# Patient Record
Sex: Male | Born: 1975
Health system: Southern US, Community
[De-identification: ages and names within clinical notes are randomized; demographics above are authoritative.]

## PROBLEM LIST (undated history)

## (undated) DIAGNOSIS — I10 Essential (primary) hypertension: Secondary | ICD-10-CM

## (undated) HISTORY — DX: Essential (primary) hypertension: I10

---

## 2006-10-05 ENCOUNTER — Ambulatory Visit: Payer: Self-pay | Admitting: Cardiovascular Disease

## 2006-10-18 ENCOUNTER — Ambulatory Visit: Payer: Self-pay

## 2006-11-07 ENCOUNTER — Ambulatory Visit: Payer: Self-pay | Admitting: Cardiovascular Disease

## 2006-11-07 LAB — CONVERTED CEMR LAB
BUN: 15 mg/dL (ref 6–23)
Basophils Absolute: 0 10*3/uL (ref 0.0–0.1)
Basophils Relative: 0.4 % (ref 0.0–1.0)
CO2: 29 meq/L (ref 19–32)
Calcium: 9 mg/dL (ref 8.4–10.5)
Creatinine, Ser: 1.2 mg/dL (ref 0.4–1.5)
INR: 0.9 (ref 0.9–2.0)
MCHC: 35.3 g/dL (ref 30.0–36.0)
Monocytes Absolute: 0.7 10*3/uL (ref 0.2–0.7)
Monocytes Relative: 8.1 % (ref 3.0–11.0)
Platelets: 247 10*3/uL (ref 150–400)
Potassium: 4 meq/L (ref 3.5–5.1)
Prothrombin Time: 11.7 s (ref 10.0–14.0)
RBC: 5.07 M/uL (ref 4.22–5.81)
RDW: 12.3 % (ref 11.5–14.6)
aPTT: 30.8 s (ref 26.5–36.5)

## 2006-11-12 ENCOUNTER — Ambulatory Visit: Payer: Self-pay | Admitting: Cardiovascular Disease

## 2006-11-12 ENCOUNTER — Inpatient Hospital Stay (HOSPITAL_BASED_OUTPATIENT_CLINIC_OR_DEPARTMENT_OTHER): Admission: RE | Admit: 2006-11-12 | Discharge: 2006-11-12 | Payer: Self-pay | Admitting: Cardiovascular Disease

## 2015-08-22 HISTORY — PX: VASECTOMY: SHX75

## 2019-12-29 ENCOUNTER — Other Ambulatory Visit: Payer: Self-pay | Admitting: *Deleted

## 2019-12-29 ENCOUNTER — Other Ambulatory Visit: Payer: Self-pay

## 2019-12-29 ENCOUNTER — Ambulatory Visit
Admission: RE | Admit: 2019-12-29 | Discharge: 2019-12-29 | Disposition: A | Discharge status: Home / Self Care | Payer: No Typology Code available for payment source | Source: Ambulatory Visit | Attending: Nurse Practitioner | Admitting: Nurse Practitioner

## 2019-12-29 ENCOUNTER — Other Ambulatory Visit: Payer: Self-pay | Admitting: Nurse Practitioner

## 2019-12-29 DIAGNOSIS — T1490XA Injury, unspecified, initial encounter: Secondary | ICD-10-CM

## 2021-03-05 ENCOUNTER — Ambulatory Visit: Payer: Self-pay

## 2021-03-11 ENCOUNTER — Ambulatory Visit (INDEPENDENT_AMBULATORY_CARE_PROVIDER_SITE_OTHER): Payer: 59

## 2021-03-11 ENCOUNTER — Ambulatory Visit
Admission: RE | Admit: 2021-03-11 | Discharge: 2021-03-11 | Disposition: A | Discharge status: Home / Self Care | Payer: 59 | Source: Ambulatory Visit | Attending: Student | Admitting: Student

## 2021-03-11 ENCOUNTER — Other Ambulatory Visit: Payer: Self-pay

## 2021-03-11 VITALS — BP 176/109 | HR 96 | Temp 97.5°F | Resp 22

## 2021-03-11 DIAGNOSIS — S46812A Strain of other muscles, fascia and tendons at shoulder and upper arm level, left arm, initial encounter: Secondary | ICD-10-CM

## 2021-03-11 DIAGNOSIS — M542 Cervicalgia: Secondary | ICD-10-CM

## 2021-03-11 DIAGNOSIS — S161XXA Strain of muscle, fascia and tendon at neck level, initial encounter: Secondary | ICD-10-CM | POA: Diagnosis not present

## 2021-03-11 DIAGNOSIS — M62838 Other muscle spasm: Secondary | ICD-10-CM | POA: Diagnosis not present

## 2021-03-11 DIAGNOSIS — F5102 Adjustment insomnia: Secondary | ICD-10-CM

## 2021-03-11 MED ORDER — KETOROLAC TROMETHAMINE 10 MG PO TABS: 10.0000 mg | ORAL_TABLET | Freq: Four times a day (QID) | ORAL | 0 refills | Status: DC | PRN

## 2021-03-11 MED ORDER — TIZANIDINE HCL 2 MG PO TABS: 2.0000 mg | ORAL_TABLET | Freq: Three times a day (TID) | ORAL | 0 refills | Status: DC | PRN

## 2021-03-11 MED ORDER — HYDROXYZINE HCL 25 MG PO TABS: 25.0000 mg | ORAL_TABLET | Freq: Four times a day (QID) | ORAL | 0 refills | Status: DC
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
## 2021-03-11 NOTE — Discharge Instructions (Addendum)
-  Start the muscle relaxer-Zanaflex (tizanidine), up to 3 times daily for muscle spasms and pain.  This can make you drowsy, so take at bedtime or when you do not need to drive or operate machinery. -For insomnia, hydroxyzine (Atarax).  You can also take this if you feel anxious throughout the day, but be careful because it can cause drowsiness.  Do not take the hydroxyzine and tizanidine together, as they both can cause drowsiness.  For pain, stop ibuprofen and start Toradol.  Do not take other NSAIDs while you take this medication like ibuprofen and Aleve, but you can still take Tylenol 1000 mg up to 3 times daily.  Make sure to take this medication with food, and avoid taking alcohol with this medication. -Follow-up with [facility] if symptoms worsen/persist in about 5 days, information below.
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
## 2021-08-21 HISTORY — PX: VASECTOMY REVERSAL: SHX243

## 2022-01-11 ENCOUNTER — Other Ambulatory Visit: Payer: Self-pay | Admitting: Neurological Surgery

## 2022-01-11 DIAGNOSIS — M542 Cervicalgia: Secondary | ICD-10-CM

## 2022-01-23 ENCOUNTER — Ambulatory Visit
Admission: RE | Admit: 2022-01-23 | Discharge: 2022-01-23 | Disposition: A | Discharge status: Home / Self Care | Payer: 59 | Source: Ambulatory Visit | Attending: Neurological Surgery | Admitting: Neurological Surgery

## 2022-01-23 DIAGNOSIS — M542 Cervicalgia: Secondary | ICD-10-CM

## 2022-02-14 ENCOUNTER — Ambulatory Visit: Payer: 59 | Admitting: Physical Therapy

## 2022-02-17 ENCOUNTER — Ambulatory Visit
Admission: RE | Admit: 2022-02-17 | Discharge: 2022-02-17 | Disposition: A | Discharge status: Home / Self Care | Payer: 59 | Source: Ambulatory Visit | Attending: Family Medicine | Admitting: Family Medicine

## 2022-02-17 ENCOUNTER — Ambulatory Visit (HOSPITAL_COMMUNITY): Payer: Self-pay

## 2022-02-17 ENCOUNTER — Ambulatory Visit (INDEPENDENT_AMBULATORY_CARE_PROVIDER_SITE_OTHER): Payer: 59

## 2022-02-17 VITALS — BP 151/106 | HR 93 | Temp 97.9°F | Resp 20

## 2022-02-17 DIAGNOSIS — S6701XA Crushing injury of right thumb, initial encounter: Secondary | ICD-10-CM | POA: Diagnosis not present

## 2022-02-17 DIAGNOSIS — L03012 Cellulitis of left finger: Secondary | ICD-10-CM

## 2022-02-17 DIAGNOSIS — L03011 Cellulitis of right finger: Secondary | ICD-10-CM | POA: Diagnosis not present

## 2022-02-17 DIAGNOSIS — M79644 Pain in right finger(s): Secondary | ICD-10-CM | POA: Diagnosis not present

## 2022-02-17 MED ORDER — PREDNISONE 20 MG PO TABS: 20.0000 mg | ORAL_TABLET | Freq: Every day | ORAL | 0 refills | Status: AC

## 2022-02-17 MED ORDER — KETOROLAC TROMETHAMINE 10 MG PO TABS: 10.0000 mg | ORAL_TABLET | Freq: Three times a day (TID) | ORAL | 0 refills | Status: DC | PRN

## 2022-02-17 MED ORDER — SULFAMETHOXAZOLE-TRIMETHOPRIM 800-160 MG PO TABS: 1.0000 | ORAL_TABLET | Freq: Two times a day (BID) | ORAL | 0 refills | Status: DC

## 2022-02-17 NOTE — Discharge Instructions (Signed)
Start prednisone and Bactrim today for swelling, inflammation and infection involving your right thumb.  Your x-ray was negative for fracture.  Toradol you may take after finishing the prednisone for acute pain. Your symptoms should gradually improve as your infection and swelling resolves.

## 2022-02-17 NOTE — ED Triage Notes (Addendum)
Pt presents with complaints of smashing his right thumb Tuesday night while grilling. Pain has not subsided with ibuprofen.

## 2022-02-17 NOTE — ED Provider Notes (Signed)
[facility]    CSN: [id number] Arrival date & time: 02/17/22  1435      History   Chief Complaint Chief Complaint  Patient presents with  . Finger Injury    Entered by patient    HPI Drew Navarro is a 46 y.o. male.   HPI Patient here for evaluation of a right thumb injury.  Patient reports that he was grilling and inadvertently slammed the tip of his right thumb down on his grill.  He reported initially having some mild bruising but no significant pain.  Over the last day he has noticed increased swelling, redness and discoloration at the cuticle level of his thumb.  He is also had some swelling at the lateral aspect of his thumb proximal to the nailbed.  History reviewed. No pertinent past medical history.  There are no problems to display for this patient.   History reviewed. No pertinent surgical history.     Home Medications    Prior to Admission medications   Medication Sig Start Date End Date Taking? Authorizing Provider  hydrOXYzine (ATARAX/VISTARIL) 25 MG tablet Take 1 tablet (25 mg total) by mouth every 6 (six) hours. Taken up to every 6 hours for anxiety and insomnia. This medication will cause drowsiness. 03/11/21   Rhys Martini, PA-C  ketorolac (TORADOL) 10 MG tablet Take 1 tablet (10 mg total) by mouth every 6 (six) hours as needed. 03/11/21   Rhys Martini, PA-C  tiZANidine (ZANAFLEX) 2 MG tablet Take 1 tablet (2 mg total) by mouth every 8 (eight) hours as needed for muscle spasms. 03/11/21   Rhys Martini, PA-C    Family History Family History  Family history unknown: Yes    Social History Social History   Tobacco Use  . Smoking status: Never  . Smokeless tobacco: Never  Substance Use Topics  . Alcohol use: Not Currently  . Drug use: Never     Allergies   Patient has no known allergies.   Review of Systems Review of Systems   Physical Exam Triage Vital Signs ED Triage Vitals  Enc Vitals Group     BP 02/17/22 1508  (!) 151/106     Pulse Rate 02/17/22 1505 93     Resp 02/17/22 1505 20     Temp 02/17/22 1505 97.9 F (36.6 C)     Temp Source 02/17/22 1505 Oral     SpO2 02/17/22 1505 96 %     Weight --      Height --      Head Circumference --      Peak Flow --      Pain Score 02/17/22 1506 10     Pain Loc --      Pain Edu? --      Excl. in GC? --    No data found.  Updated Vital Signs BP (!) 151/106 Comment: taken x 3  Pulse 93   Temp 97.9 F (36.6 C) (Oral)   Resp 20   SpO2 96%   Visual Acuity Right Eye Distance:   Left Eye Distance:   Bilateral Distance:    Right Eye Near:   Left Eye Near:    Bilateral Near:     Physical Exam   UC Treatments / Results  Labs (all labs ordered are listed, but only abnormal results are displayed) Labs Reviewed - No data to display  EKG   Radiology DG Finger Thumb Right  Result Date: 02/17/2022 CLINICAL DATA:  Thumb injury  EXAM: RIGHT THUMB 2+V COMPARISON:  None Available. FINDINGS: There is no evidence of fracture or dislocation. There is no evidence of arthropathy or other focal bone abnormality. Soft tissues are unremarkable. IMPRESSION: Negative. Electronically Signed   By: Jasmine Pang M.D.   On: 02/17/2022 15:28    Procedures Procedures (including critical care time)  Medications Ordered in UC Medications - No data to display  Initial Impression / Assessment and Plan / UC Course  I have reviewed the triage vital signs and the nursing notes.  Pertinent labs & imaging results that were available during my care of the patient were reviewed by me and considered in my medical decision making (see chart for details).     *** Final Clinical Impressions(s) / UC Diagnoses   Final diagnoses:  None   Discharge Instructions   None    ED Prescriptions   None    PDMP not reviewed this encounter.

## 2022-02-22 ENCOUNTER — Ambulatory Visit: Payer: 59 | Admitting: Nurse Practitioner
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
## 2022-02-23 ENCOUNTER — Ambulatory Visit: Payer: 59 | Attending: Neurological Surgery

## 2022-02-23 DIAGNOSIS — M542 Cervicalgia: Secondary | ICD-10-CM | POA: Diagnosis present

## 2022-03-08 ENCOUNTER — Ambulatory Visit (INDEPENDENT_AMBULATORY_CARE_PROVIDER_SITE_OTHER): Payer: 59 | Admitting: Nurse Practitioner

## 2022-03-08 ENCOUNTER — Encounter: Payer: Self-pay | Admitting: Nurse Practitioner

## 2022-03-08 VITALS — BP 145/88 | HR 84 | Temp 97.8°F | Ht 74.0 in | Wt 298.0 lb

## 2022-03-08 DIAGNOSIS — Z6836 Body mass index (BMI) 36.0-36.9, adult: Secondary | ICD-10-CM | POA: Insufficient documentation

## 2022-03-08 DIAGNOSIS — Z6838 Body mass index (BMI) 38.0-38.9, adult: Secondary | ICD-10-CM

## 2022-03-08 DIAGNOSIS — I10 Essential (primary) hypertension: Secondary | ICD-10-CM | POA: Insufficient documentation

## 2022-03-08 DIAGNOSIS — Z7689 Persons encountering health services in other specified circumstances: Secondary | ICD-10-CM | POA: Diagnosis not present

## 2022-03-08 DIAGNOSIS — F4310 Post-traumatic stress disorder, unspecified: Secondary | ICD-10-CM

## 2022-03-08 DIAGNOSIS — R030 Elevated blood-pressure reading, without diagnosis of hypertension: Secondary | ICD-10-CM

## 2022-03-08 HISTORY — DX: Post-traumatic stress disorder, unspecified: F43.10
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
## 2022-03-09 ENCOUNTER — Ambulatory Visit: Payer: 59

## 2022-04-19 ENCOUNTER — Encounter: Payer: 59 | Admitting: Nurse Practitioner

## 2022-04-19 NOTE — Progress Notes (Deleted)
Complete physical exam   Patient: Drew Navarro   DOB: [birth year]   46 y.o. Male  MRN: [id number] Visit Date: 04/19/2022    No chief complaint on file.  Subjective    Drew Navarro is a 46 y.o. male who presents today for a complete physical exam.  He reports consuming a {diet types:17450} diet. {Exercise:19826} He generally feels {well/fairly well/poorly:18703}. He {does/does not:200015} have additional problems to discuss today. Marland Kitchen  HPI  Annual physical  -elevated blood pressure  -recently had vasectomy  -history of PTSD related to death of his daughter.    Past Medical History:  Diagnosis Date   PTSD (post-traumatic stress disorder) 03/08/2022   No past surgical history on file. Social History   Socioeconomic History   Marital status: Married    Spouse name: Not on file   Number of children: Not on file   Years of education: Not on file   Highest education level: Not on file  Occupational History   Not on file  Tobacco Use   Smoking status: Never   Smokeless tobacco: Never  Substance and Sexual Activity   Alcohol use: Yes    Alcohol/week: 10.0 standard drinks of alcohol    Types: 10 Cans of beer per week   Drug use: Never   Sexual activity: Yes  Other Topics Concern   Not on file  Social History Narrative   Not on file   Social Determinants of Health   Financial Resource Strain: Not on file  Food Insecurity: Not on file  Transportation Needs: Not on file  Physical Activity: Not on file  Stress: Not on file  Social Connections: Not on file  Intimate Partner Violence: Not on file   Family Status  Relation Name Status   Father  (Not Specified)   Family History  Problem Relation Age of Onset   Heart attack Father    No Known Allergies  Patient Care Team: Carlean Jews, NP as PCP - General (Family Medicine)   Medications: Outpatient Medications Prior to Visit  Medication Sig   hydrOXYzine (ATARAX/VISTARIL) 25 MG tablet Take 1 tablet (25  mg total) by mouth every 6 (six) hours. Taken up to every 6 hours for anxiety and insomnia. This medication will cause drowsiness.   ketorolac (TORADOL) 10 MG tablet Take 1 tablet (10 mg total) by mouth every 8 (eight) hours as needed for moderate pain or severe pain.   sulfamethoxazole-trimethoprim (BACTRIM DS) 800-160 MG tablet Take 1 tablet by mouth 2 (two) times daily.   tiZANidine (ZANAFLEX) 2 MG tablet Take 1 tablet (2 mg total) by mouth every 8 (eight) hours as needed for muscle spasms.   No facility-administered medications prior to visit.    Review of Systems  {Labs (Optional):23779}   Objective    There were no vitals taken for this visit. BP Readings from Last 3 Encounters:  03/08/22 (!) 145/88  02/17/22 (!) 151/106  03/11/21 (!) 176/109    Wt Readings from Last 3 Encounters:  03/08/22 298 lb (135.2 kg)     Physical Exam  ***  Last depression screening scores    03/08/2022    3:04 PM  PHQ 2/9 Scores  PHQ - 2 Score 2  PHQ- 9 Score 9   Last fall risk screening     No data to display         Last Audit-C alcohol use screening     No data to display  A score of 3 or more in women, and 4 or more in men indicates increased risk for alcohol abuse, EXCEPT if all of the points are from question 1   No results found for any visits on 04/19/22.  Assessment & Plan    Routine Health Maintenance and Physical Exam  Exercise Activities and Dietary recommendations  Goals   None      There is no immunization history on file for this patient.  Health Maintenance  Topic Date Due   COVID-19 Vaccine (1) Never done   HIV Screening  Never done   Hepatitis C Screening  Never done   TETANUS/TDAP  Never done   INFLUENZA VACCINE  03/21/2022   HPV VACCINES  Aged Out    Discussed health benefits of physical activity, and encouraged him to engage in regular exercise appropriate for his age and condition.  Problem List Items Addressed This Visit   None     No follow-ups on file.        Carlean Jews, NP  [facility] [phone] (phone) [phone] (fax)  [facility]

## 2022-07-11 ENCOUNTER — Ambulatory Visit
Admission: RE | Admit: 2022-07-11 | Discharge: 2022-07-11 | Disposition: A | Discharge status: Home / Self Care | Payer: 59 | Source: Ambulatory Visit | Attending: Physician Assistant | Admitting: Physician Assistant

## 2022-07-11 VITALS — BP 146/94 | HR 88 | Temp 98.0°F | Resp 19

## 2022-07-11 DIAGNOSIS — Z20822 Contact with and (suspected) exposure to covid-19: Secondary | ICD-10-CM | POA: Diagnosis present

## 2022-07-11 DIAGNOSIS — J069 Acute upper respiratory infection, unspecified: Secondary | ICD-10-CM | POA: Diagnosis present

## 2022-07-11 LAB — RESP PANEL BY RT-PCR (FLU A&B, COVID) ARPGX2
Influenza A by PCR: POSITIVE — AB
Influenza B by PCR: NEGATIVE
SARS Coronavirus 2 by RT PCR: NEGATIVE

## 2022-07-11 NOTE — ED Triage Notes (Signed)
Pt presents to [facility] with co of cough and congestion since Sunday. Pts wife tested pos for covid yesterday. Pt needs test   Pt has been taking musinex and day quill for symptoms

## 2022-07-11 NOTE — ED Provider Notes (Signed)
[facility]    CSN: [id number] Arrival date & time: 07/11/22  1616      History   Chief Complaint Chief Complaint  Patient presents with   Cough    HPI Terris Germano is a 46 y.o. male.   Patient here today for evaluation of cough and congestion that started 2 days ago. He reports symptoms were much worse yesterday. He has had some fever with tmax 102F. His wife was recently diagnosed with Covid. He reports some diarrhea but no vomiting.   The history is provided by the patient.  Cough Associated symptoms: sore throat   Associated symptoms: no chills, no eye discharge and no fever     Past Medical History:  Diagnosis Date   PTSD (post-traumatic stress disorder) 03/08/2022    Patient Active Problem List   Diagnosis Date Noted   Elevated systolic blood pressure reading without diagnosis of hypertension 03/08/2022   BMI 38.0-38.9,adult 03/08/2022   PTSD (post-traumatic stress disorder) 03/08/2022    History reviewed. No pertinent surgical history.     Home Medications    Prior to Admission medications   Medication Sig Start Date End Date Taking? Authorizing Provider  hydrOXYzine (ATARAX/VISTARIL) 25 MG tablet Take 1 tablet (25 mg total) by mouth every 6 (six) hours. Taken up to every 6 hours for anxiety and insomnia. This medication will cause drowsiness. 03/11/21   Rhys Martini, PA-C  ketorolac (TORADOL) 10 MG tablet Take 1 tablet (10 mg total) by mouth every 8 (eight) hours as needed for moderate pain or severe pain. 02/17/22   Bing Neighbors, FNP  sulfamethoxazole-trimethoprim (BACTRIM DS) 800-160 MG tablet Take 1 tablet by mouth 2 (two) times daily. 02/17/22   Bing Neighbors, FNP  tiZANidine (ZANAFLEX) 2 MG tablet Take 1 tablet (2 mg total) by mouth every 8 (eight) hours as needed for muscle spasms. 03/11/21   Rhys Martini, PA-C    Family History Family History  Problem Relation Age of Onset   Heart attack Father     Social  History Social History   Tobacco Use   Smoking status: Never   Smokeless tobacco: Never  Substance Use Topics   Alcohol use: Yes    Alcohol/week: 10.0 standard drinks of alcohol    Types: 10 Cans of beer per week   Drug use: Never     Allergies   Patient has no known allergies.   Review of Systems Review of Systems  Constitutional:  Negative for chills and fever.  HENT:  Positive for congestion and sore throat.   Eyes:  Negative for discharge and redness.  Respiratory:  Positive for cough.   Gastrointestinal:  Positive for diarrhea. Negative for nausea and vomiting.  Neurological:  Negative for numbness.     Physical Exam Triage Vital Signs ED Triage Vitals  Enc Vitals Group     BP 07/11/22 1628 (!) 146/94     Pulse Rate 07/11/22 1628 88     Resp 07/11/22 1628 19     Temp 07/11/22 1628 98 F (36.7 C)     Temp src --      SpO2 07/11/22 1628 98 %     Weight --      Height --      Head Circumference --      Peak Flow --      Pain Score 07/11/22 1627 7     Pain Loc --      Pain Edu? --  Excl. in GC? --    No data found.  Updated Vital Signs BP (!) 146/94   Pulse 88   Temp 98 F (36.7 C)   Resp 19   SpO2 98%      Physical Exam Vitals and nursing note reviewed.  Constitutional:      General: He is not in acute distress.    Appearance: Normal appearance. He is not ill-appearing.  HENT:     Head: Normocephalic and atraumatic.  Eyes:     Conjunctiva/sclera: Conjunctivae normal.  Cardiovascular:     Rate and Rhythm: Normal rate.  Pulmonary:     Effort: Pulmonary effort is normal.  Neurological:     Mental Status: He is alert.  Psychiatric:        Mood and Affect: Mood normal.        Behavior: Behavior normal.        Thought Content: Thought content normal.      UC Treatments / Results  Labs (all labs ordered are listed, but only abnormal results are displayed) Labs Reviewed  RESP PANEL BY RT-PCR (FLU A&B, COVID) [id number]     EKG   Radiology No results found.  Procedures Procedures (including critical care time)  Medications Ordered in UC Medications - No data to display  Initial Impression / Assessment and Plan / UC Course  I have reviewed the triage vital signs and the nursing notes.  Pertinent labs & imaging results that were available during my care of the patient were reviewed by me and considered in my medical decision making (see chart for details).    Suspect viral etiology of symptoms. Will screen for covid and flu. Recommend symptomatic treatment, increased fluids and rest. Encourage follow up with any further  Final Clinical Impressions(s) / UC Diagnoses   Final diagnoses:  Acute upper respiratory infection  Exposure to COVID-19 virus   Discharge Instructions   None    ED Prescriptions   None    PDMP not reviewed this encounter.   Tomi Bamberger, PA-C 07/11/22 1746

## 2022-12-28 ENCOUNTER — Encounter: Payer: Self-pay | Admitting: Family Medicine

## 2022-12-28 ENCOUNTER — Ambulatory Visit: Payer: 59 | Admitting: Family Medicine

## 2022-12-28 VITALS — BP 139/90 | HR 76 | Resp 18 | Ht 74.0 in | Wt 290.0 lb

## 2022-12-28 DIAGNOSIS — Z1159 Encounter for screening for other viral diseases: Secondary | ICD-10-CM | POA: Diagnosis not present

## 2022-12-28 DIAGNOSIS — Z130 Encounter for screening for diseases of the blood and blood-forming organs and certain disorders involving the immune mechanism: Secondary | ICD-10-CM

## 2022-12-28 DIAGNOSIS — Z6837 Body mass index (BMI) 37.0-37.9, adult: Secondary | ICD-10-CM

## 2022-12-28 DIAGNOSIS — Z1321 Encounter for screening for nutritional disorder: Secondary | ICD-10-CM

## 2022-12-28 DIAGNOSIS — B9689 Other specified bacterial agents as the cause of diseases classified elsewhere: Secondary | ICD-10-CM

## 2022-12-28 DIAGNOSIS — Z13228 Encounter for screening for other metabolic disorders: Secondary | ICD-10-CM

## 2022-12-28 DIAGNOSIS — I10 Essential (primary) hypertension: Secondary | ICD-10-CM

## 2022-12-28 DIAGNOSIS — Z1329 Encounter for screening for other suspected endocrine disorder: Secondary | ICD-10-CM

## 2022-12-28 DIAGNOSIS — H6693 Otitis media, unspecified, bilateral: Secondary | ICD-10-CM

## 2022-12-28 DIAGNOSIS — J019 Acute sinusitis, unspecified: Secondary | ICD-10-CM

## 2022-12-28 MED ORDER — AMOXICILLIN-POT CLAVULANATE 875-125 MG PO TABS: 1.0000 | ORAL_TABLET | Freq: Two times a day (BID) | ORAL | 0 refills | Status: DC

## 2022-12-28 NOTE — Assessment & Plan Note (Signed)
Patient meets diagnosis criteria for hypertension with at least 2 readings on 2 separate occasions of blood pressure above 120/80. At prior visits within the [facility] health system, blood pressures have been 151/106, 145/88, 146/94.  In office today, blood pressure 140/99 initially, 139/90 on repeat.  We discussed the criteria and meaning of his new diagnosis.  He denies any known family history of hypertension.  Since he received the news that his blood pressure was high at his work physical a couple of weeks ago, he has made changes to his diet and exercise routines and states that he has lost several pounds already.  We discussed that management of hypertension starts with lifestyle intervention including some of the changes that he has already made.  We discussed specifically the importance of limiting sodium in his diet.  He would like to continue with lifestyle interventions for at least a couple of more months to see the impact that it has on his blood pressure before starting medication.  Provided printed educational materials on DASH diet as well as valsartan in case medication does become necessary in the future.  Encourage patient to continue with the lifestyle changes that he has already enacted.  Patient's wife is a Engineer, civil (consulting) and they do have a blood pressure monitor at home, but he does need to purchase a cuff that will fit his arm.  He is planning on doing so to start ambulatory blood pressure monitoring.

## 2022-12-28 NOTE — Patient Instructions (Signed)
I have included some information about the medicine that we may consider starting if we need to for your blood pressure.  I also included some additional information about the low-sodium diet that is recommended for hypertension which is called the DASH diet.  In general, try to limit your sodium intake to about 2000 mg daily.  Keep up the good work with the habits that you have started already, and check your blood pressure at home.

## 2022-12-28 NOTE — Progress Notes (Signed)
Acute Office Visit  Subjective:     Patient ID: Drew Navarro, male    DOB: 01-31-1976, 47 y.o.   MRN: [id number]  Chief Complaint  Patient presents with   Hypertension    HPI Patient is in today for blood pressure.  He works at the [employer] and they have annual physicals.  When he had this done a few weeks ago, his blood pressure was elevated and they recommended follow-up with primary care.  At prior visits within the [facility] health system, blood pressures have been 151/106, 145/88, 146/94. He is also complaining of ear fullness and facial pressure that started approximately 4 days ago.  He and his wife returned from a trip to [facility] and he began developing a headache.  This progressed into ear fullness and facial pressure.  These gradually worsened since they started 3 days ago.  He endorses the occasional dry cough especially at night.  He denies any fever, chills, malaise, myalgias, sore throat, chest pain, nausea/vomiting.  ROS Negative unless otherwise noted in HPI    Objective:    BP (!) 139/90 (BP Location: Right Arm, Patient Position: Sitting, Cuff Size: Large)   Pulse 76   Resp 18   Ht 6\' 2"  (1.88 m)   Wt 290 lb (131.5 kg)   SpO2 97%   BMI 37.23 kg/m   Physical Exam Constitutional:      General: He is not in acute distress.    Appearance: Normal appearance.  HENT:     Head: Normocephalic and atraumatic.     Right Ear: Ear canal and external ear normal. Tympanic membrane is erythematous.     Left Ear: Ear canal and external ear normal. Tympanic membrane is erythematous.     Nose: Nasal tenderness present. No congestion or rhinorrhea.     Right Sinus: Frontal sinus tenderness present.     Left Sinus: Frontal sinus tenderness present.     Mouth/Throat:     Mouth: Mucous membranes are moist.     Pharynx: No oropharyngeal exudate or posterior oropharyngeal erythema.  Eyes:     Extraocular Movements: Extraocular movements intact.     Conjunctiva/sclera:  Conjunctivae normal.     Pupils: Pupils are equal, round, and reactive to light.  Cardiovascular:     Rate and Rhythm: Normal rate and regular rhythm.     Pulses: Normal pulses.     Heart sounds: Normal heart sounds. No murmur heard.    No friction rub. No gallop.  Pulmonary:     Effort: Pulmonary effort is normal. No respiratory distress.     Breath sounds: Normal breath sounds. No wheezing, rhonchi or rales.  Skin:    General: Skin is warm and dry.  Neurological:     Mental Status: He is alert and oriented to person, place, and time.  Psychiatric:        Mood and Affect: Mood normal.      Assessment & Plan:  Patient will be due for his annual physical and lab work in July.  Ordered screening labs to be drawn 1 week prior to that appointment including HIV and hepatitis C screenings which patient is agreeable to.    Primary hypertension Assessment & Plan: Patient meets diagnosis criteria for hypertension with at least 2 readings on 2 separate occasions of blood pressure above 120/80. At prior visits within the [facility], blood pressures have been 151/106, 145/88, 146/94.  In office today, blood pressure 140/99 initially, 139/90 on repeat.  We discussed the criteria and meaning of his new diagnosis.  He denies any known family history of hypertension.  Since he received the news that his blood pressure was high at his work physical a couple of weeks ago, he has made changes to his diet and exercise routines and states that he has lost several pounds already.  We discussed that management of hypertension starts with lifestyle intervention including some of the changes that he has already made.  We discussed specifically the importance of limiting sodium in his diet.  He would like to continue with lifestyle interventions for at least a couple of more months to see the impact that it has on his blood pressure before starting medication.  Provided printed educational materials on DASH  diet as well as valsartan in case medication does become necessary in the future.  Encourage patient to continue with the lifestyle changes that he has already enacted.  Patient's wife is a Engineer, civil (consulting) and they do have a blood pressure monitor at home, but he does need to purchase a cuff that will fit his arm.  He is planning on doing so to start ambulatory blood pressure monitoring.   Class 2 severe obesity due to excess calories with serious comorbidity and body mass index (BMI) of 37.0 to 37.9 in adult (HCC) -     CBC with Differential/Platelet; Future -     Comprehensive metabolic panel; Future -     Hemoglobin A1c; Future -     Lipid panel; Future -     VITAMIN D 25 Hydroxy (Vit-D Deficiency, Fractures); Future -     TSH; Future -     T4, free; Future  Screening for viral disease -     Hepatitis C antibody; Future -     HIV Antibody (routine testing w rflx); Future  Screening for endocrine, nutritional, metabolic and immunity disorder -     CBC with Differential/Platelet; Future -     Comprehensive metabolic panel; Future -     Hemoglobin A1c; Future -     Lipid panel; Future -     VITAMIN D 25 Hydroxy (Vit-D Deficiency, Fractures); Future -     TSH; Future -     T4, free; Future -     Hepatitis C antibody; Future -     HIV Antibody (routine testing w rflx); Future  Acute bacterial rhinosinusitis -     Amoxicillin-Pot Clavulanate; Take 1 tablet by mouth 2 (two) times daily.  Dispense: 20 tablet; Refill: 0  Acute bilateral otitis media -     Amoxicillin-Pot Clavulanate; Take 1 tablet by mouth 2 (two) times daily.  Dispense: 20 tablet; Refill: 0  Acute bacterial rhinosinusitis/otitis media Patient with sinus tenderness on exam and bilateral erythematous tympanic membranes.  Clinical diagnosis of bacterial rhinosinusitis and otitis media.  Will treat with course of Augmentin, reviewed allergies with NKDA.  Return in about 2 months (around 02/27/2023) for annual physical, fasting blood  work 1 week before.  I spent 30 minutes on the day of the encounter to include pre-visit record review, face-to-face time with the patient providing education and counseling on new diagnosis of hypertension, and post visit ordering of test.  Melida Quitter, PA

## 2023-03-01 ENCOUNTER — Other Ambulatory Visit: Payer: 59

## 2023-03-01 DIAGNOSIS — Z1159 Encounter for screening for other viral diseases: Secondary | ICD-10-CM

## 2023-03-01 DIAGNOSIS — Z130 Encounter for screening for diseases of the blood and blood-forming organs and certain disorders involving the immune mechanism: Secondary | ICD-10-CM

## 2023-03-02 LAB — COMPREHENSIVE METABOLIC PANEL
ALT: 44 IU/L (ref 0–44)
AST: 31 IU/L (ref 0–40)
Albumin: 4.8 g/dL (ref 4.1–5.1)
Alkaline Phosphatase: 72 IU/L (ref 44–121)
BUN/Creatinine Ratio: 13 (ref 9–20)
BUN: 18 mg/dL (ref 6–24)
Bilirubin Total: 1 mg/dL (ref 0.0–1.2)
CO2: 25 mmol/L (ref 20–29)
Calcium: 9.7 mg/dL (ref 8.7–10.2)
Chloride: 102 mmol/L (ref 96–106)
Creatinine, Ser: 1.43 mg/dL — ABNORMAL HIGH (ref 0.76–1.27)
Globulin, Total: 2.1 g/dL (ref 1.5–4.5)
Glucose: 100 mg/dL — ABNORMAL HIGH (ref 70–99)
Potassium: 4.7 mmol/L (ref 3.5–5.2)
Sodium: 141 mmol/L (ref 134–144)
Total Protein: 6.9 g/dL (ref 6.0–8.5)
eGFR: 62 mL/min/{1.73_m2} (ref >59)

## 2023-03-02 LAB — CBC WITH DIFFERENTIAL/PLATELET
Basophils Absolute: 0.1 10*3/uL (ref 0.0–0.2)
Basos: 1 %
EOS (ABSOLUTE): 0.3 10*3/uL (ref 0.0–0.4)
Eos: 4 %
Hematocrit: 46 % (ref 37.5–51.0)
Hemoglobin: 15.8 g/dL (ref 13.0–17.7)
Immature Grans (Abs): 0 10*3/uL (ref 0.0–0.1)
Immature Granulocytes: 0 %
Lymphocytes Absolute: 1.7 10*3/uL (ref 0.7–3.1)
Lymphs: 22 %
MCH: 30.7 pg (ref 26.6–33.0)
MCHC: 34.3 g/dL (ref 31.5–35.7)
MCV: 90 fL (ref 79–97)
Monocytes Absolute: 0.5 10*3/uL (ref 0.1–0.9)
Monocytes: 7 %
Neutrophils Absolute: 5.1 10*3/uL (ref 1.4–7.0)
Neutrophils: 66 %
Platelets: 239 10*3/uL (ref 150–450)
RBC: 5.14 x10E6/uL (ref 4.14–5.80)
RDW: 13.7 % (ref 11.6–15.4)
WBC: 7.7 10*3/uL (ref 3.4–10.8)

## 2023-03-02 LAB — LIPID PANEL
Chol/HDL Ratio: 3.2 ratio (ref 0.0–5.0)
Cholesterol, Total: 186 mg/dL (ref 100–199)
HDL: 59 mg/dL (ref >39)
LDL Chol Calc (NIH): 112 mg/dL — ABNORMAL HIGH (ref 0–99)
Triglycerides: 84 mg/dL (ref 0–149)
VLDL Cholesterol Cal: 15 mg/dL (ref 5–40)

## 2023-03-02 LAB — TSH: TSH: 1.92 u[IU]/mL (ref 0.450–4.500)

## 2023-03-02 LAB — HEPATITIS C ANTIBODY: Hep C Virus Ab: NONREACTIVE

## 2023-03-02 LAB — HIV ANTIBODY (ROUTINE TESTING W REFLEX): HIV Screen 4th Generation wRfx: NONREACTIVE

## 2023-03-02 LAB — HEMOGLOBIN A1C
Est. average glucose Bld gHb Est-mCnc: 117 mg/dL
Hgb A1c MFr Bld: 5.7 % — ABNORMAL HIGH (ref 4.8–5.6)

## 2023-03-02 LAB — VITAMIN D 25 HYDROXY (VIT D DEFICIENCY, FRACTURES): Vit D, 25-Hydroxy: 40.6 ng/mL (ref 30.0–100.0)

## 2023-03-02 LAB — T4, FREE: Free T4: 1.24 ng/dL (ref 0.82–1.77)

## 2023-03-05 ENCOUNTER — Ambulatory Visit (INDEPENDENT_AMBULATORY_CARE_PROVIDER_SITE_OTHER): Payer: 59 | Admitting: Family Medicine

## 2023-03-05 ENCOUNTER — Encounter: Payer: Self-pay | Admitting: Family Medicine

## 2023-03-05 VITALS — BP 163/106 | HR 72 | Resp 18 | Ht 74.0 in | Wt 288.0 lb

## 2023-03-05 DIAGNOSIS — I10 Essential (primary) hypertension: Secondary | ICD-10-CM

## 2023-03-05 DIAGNOSIS — R0683 Snoring: Secondary | ICD-10-CM | POA: Diagnosis not present

## 2023-03-05 DIAGNOSIS — Z1211 Encounter for screening for malignant neoplasm of colon: Secondary | ICD-10-CM

## 2023-03-05 DIAGNOSIS — Z23 Encounter for immunization: Secondary | ICD-10-CM | POA: Diagnosis not present

## 2023-03-05 DIAGNOSIS — Z6836 Body mass index (BMI) 36.0-36.9, adult: Secondary | ICD-10-CM

## 2023-03-05 DIAGNOSIS — Z0000 Encounter for general adult medical examination without abnormal findings: Secondary | ICD-10-CM | POA: Diagnosis not present

## 2023-03-05 DIAGNOSIS — Z1212 Encounter for screening for malignant neoplasm of rectum: Secondary | ICD-10-CM

## 2023-03-05 MED ORDER — HYDROCHLOROTHIAZIDE 12.5 MG PO TABS: 12.5000 mg | ORAL_TABLET | Freq: Every day | ORAL | 3 refills | Status: DC

## 2023-03-05 MED ORDER — WEGOVY 0.25 MG/0.5ML ~~LOC~~ SOAJ: 0.2500 mg | SUBCUTANEOUS | 1 refills | Status: DC

## 2023-03-05 NOTE — Assessment & Plan Note (Signed)
Blood pressures have been elevated during office visits for a long time now.  Blood pressure remained elevated 173/118 initially, 163/106 on repeat.  We discussed starting medication for management of hypertension and reducing risk of long-term complications, patient is agreeable.  Start hydrochlorothiazide 12.5 mg daily.  Encouraged to continue following a low sodium diet and getting physical activity as often as he can.  We will need a follow-up every 6 months with repeat lab work.

## 2023-03-05 NOTE — Progress Notes (Signed)
Complete physical exam  Patient: Drew Navarro   DOB: 08-06-1976   47 y.o. Male  MRN: [id number]  Subjective:    Chief Complaint  Patient presents with   Annual Exam    Shermon Bozzi is a 47 y.o. male who presents today for a complete physical exam. He reports consuming a general diet.  He generally feels well. He reports sleeping poorly, often times waking up several times through the night. He does have additional problems to discuss today.  He would like to have a sleep test conducted due to his difficulty sleeping as well as his wife's complaints of his snoring.  He was also interested in starting [facility] for weight loss.  He already called his insurance and they stated that they will cover it with a prior authorization.   Most recent fall risk assessment:    12/28/2022    2:09 PM  Fall Risk   Falls in the past year? 0  Number falls in past yr: 0  Injury with Fall? 0  Risk for fall due to : No Fall Risks  Follow up Falls evaluation completed     Most recent depression and anxiety screenings:    03/05/2023    1:24 PM 12/28/2022    2:08 PM  PHQ 2/9 Scores  PHQ - 2 Score 2 1  PHQ- 9 Score 4 5      03/05/2023    1:25 PM 12/28/2022    2:09 PM 03/08/2022    3:04 PM  GAD 7 : Generalized Anxiety Score  Nervous, Anxious, on Edge 1 1 1   Control/stop worrying 1 1 1   Worry too much - different things 1 1 1   Trouble relaxing 1 1 1   Restless 0 0 1  Easily annoyed or irritable 1 0 1  Afraid - awful might happen 1 1 1   Total GAD 7 Score 6 5 7   Anxiety Difficulty Somewhat difficult Somewhat difficult     Patient Active Problem List   Diagnosis Date Noted   Primary hypertension 03/08/2022   PTSD (post-traumatic stress disorder) 03/08/2022   Class 2 severe obesity due to excess calories with serious comorbidity and body mass index (BMI) of 36.0 to 36.9 in adult [facility]) 03/08/2022    History reviewed. No pertinent surgical history. Social History   Tobacco Use   Smoking  status: Never    Passive exposure: Never   Smokeless tobacco: Never  Substance Use Topics   Alcohol use: Yes    Alcohol/week: 10.0 standard drinks of alcohol    Types: 10 Cans of beer per week   Drug use: Never   Family History  Problem Relation Age of Onset   Heart attack Father    No Known Allergies   Patient Care Team: Melida Quitter, PA as PCP - General (Family Medicine)   Outpatient Medications Prior to Visit  Medication Sig   hydrOXYzine (ATARAX/VISTARIL) 25 MG tablet Take 1 tablet (25 mg total) by mouth every 6 (six) hours. Taken up to every 6 hours for anxiety and insomnia. This medication will cause drowsiness.   ketorolac (TORADOL) 10 MG tablet Take 1 tablet (10 mg total) by mouth every 8 (eight) hours as needed for moderate pain or severe pain.   tiZANidine (ZANAFLEX) 2 MG tablet Take 1 tablet (2 mg total) by mouth every 8 (eight) hours as needed for muscle spasms.   [DISCONTINUED] amoxicillin-clavulanate (AUGMENTIN) 875-125 MG tablet Take 1 tablet by mouth 2 (two) times daily.   No facility-administered  medications prior to visit.    Review of Systems  Constitutional:  Negative for chills, fever and malaise/fatigue.  HENT:  Negative for congestion and hearing loss.   Eyes:  Negative for blurred vision and double vision.  Respiratory:  Negative for cough and shortness of breath.   Cardiovascular:  Negative for chest pain, palpitations and leg swelling.  Gastrointestinal:  Negative for abdominal pain, constipation, diarrhea and heartburn.  Genitourinary:  Negative for frequency and urgency.  Musculoskeletal:  Negative for myalgias and neck pain.  Neurological:  Negative for headaches.  Endo/Heme/Allergies:  Negative for polydipsia.  Psychiatric/Behavioral:  Positive for depression (Upcoming anniversary of his daughter's death). The patient is not nervous/anxious and does not have insomnia.       Objective:    BP (!) 163/106 (BP Location: Left Arm, Patient  Position: Sitting, Cuff Size: Large)   Pulse 72   Resp 18   Ht 6\' 2"  (1.88 m)   Wt 288 lb (130.6 kg)   SpO2 95%   BMI 36.98 kg/m    Physical Exam Constitutional:      General: He is not in acute distress.    Appearance: Normal appearance.  HENT:     Head: Normocephalic and atraumatic.     Right Ear: Tympanic membrane, ear canal and external ear normal. There is no impacted cerumen.     Left Ear: Tympanic membrane, ear canal and external ear normal. There is no impacted cerumen.     Nose: Nose normal.     Mouth/Throat:     Mouth: Mucous membranes are moist.     Pharynx: Oropharynx is clear. No posterior oropharyngeal erythema.  Eyes:     Extraocular Movements: Extraocular movements intact.     Conjunctiva/sclera: Conjunctivae normal.     Pupils: Pupils are equal, round, and reactive to light.     Comments: Wears contacts, prescription up-to-date  Neck:     Thyroid: No thyroid mass, thyromegaly or thyroid tenderness.  Cardiovascular:     Rate and Rhythm: Normal rate and regular rhythm.     Heart sounds: Normal heart sounds. No murmur heard.    No friction rub. No gallop.  Pulmonary:     Effort: Pulmonary effort is normal. No respiratory distress.     Breath sounds: Normal breath sounds. No stridor. No wheezing or rales.  Abdominal:     General: Bowel sounds are normal.     Palpations: Abdomen is soft. There is no mass.     Tenderness: There is no abdominal tenderness.  Musculoskeletal:        General: Normal range of motion.     Cervical back: Normal range of motion and neck supple.  Lymphadenopathy:     Cervical: No cervical adenopathy.  Skin:    General: Skin is warm and dry.  Neurological:     Mental Status: He is alert and oriented to person, place, and time.     Cranial Nerves: No cranial nerve deficit.     Motor: No weakness.     Deep Tendon Reflexes: Reflexes normal.  Psychiatric:        Mood and Affect: Mood normal.        Assessment & Plan:    Routine  Health Maintenance and Physical Exam  Immunization History  Administered Date(s) Administered   Tdap 03/05/2023    Health Maintenance  Topic Date Due   COVID-19 Vaccine (1 - 2023-24 season) Never done   Colonoscopy  Never done   INFLUENZA VACCINE  03/22/2023   DTaP/Tdap/Td (2 - Td or Tdap) 03/04/2033   Hepatitis C Screening  Completed   HIV Screening  Completed   HPV VACCINES  Aged Out   We discussed the need for colorectal cancer screening starting at age 77 and the options including colonoscopy and Cologuard as he does not have any family history of colon cancer.  We discussed the risks and benefits of each screening option, he would like to have a referral placed for the colonoscopy.  He is not entirely sure of when his last tetanus shot was.  He is open to having it done today so that it is in the system and he can be sure that he is up-to-date.  Discussed recent lab work including CBC, CMP, A1c, lipid panel, TSH.  Labs were within normal limits with the exception of LDL 112, A1c 5.7, and slightly elevated creatinine 1.43.  We will continue to monitor these levels.  Discussed health benefits of physical activity, and encouraged him to engage in regular exercise appropriate for his age and condition.  Wellness examination  Primary hypertension Assessment & Plan: Blood pressures have been elevated during office visits for a long time now.  Blood pressure remained elevated 173/118 initially, 163/106 on repeat.  We discussed starting medication for management of hypertension and reducing risk of long-term complications, patient is agreeable.  Start hydrochlorothiazide 12.5 mg daily.  Encouraged to continue following a low sodium diet and getting physical activity as often as he can.  We will need a follow-up every 6 months with repeat lab work.  Orders: -     hydroCHLOROthiazide; Take 1 tablet (12.5 mg total) by mouth daily.  Dispense: 30 tablet; Refill: 3  Class 2 severe obesity  due to excess calories with serious comorbidity and body mass index (BMI) of 36.0 to 36.9 in adult [facility]) Assessment & Plan: In addition to physical activity and caloric deficit, starting Wegovy 0.25 mg weekly injection for weight management.  We discussed this medications mechanism of action, common side effects, and dosing schedule.  Patient verbalized understanding and is agreeable to this plan.  We will follow-up in 4-6 weeks to ensure that he is tolerating the medication well and discuss increasing the dose.  Orders: -     Wegovy; Inject 0.25 mg into the skin once a week.  Dispense: 2 mL; Refill: 1  Snoring -     Home sleep test  Screening for colorectal cancer -     Ambulatory referral to Gastroenterology  Need for Tdap vaccination -     Tdap vaccine greater than or equal to 7yo IM    Return in about 5 weeks (around 04/09/2023) for follow-up for starting [facility].     Melida Quitter, PA

## 2023-03-05 NOTE — Assessment & Plan Note (Signed)
In addition to physical activity and caloric deficit, starting Wegovy 0.25 mg weekly injection for weight management.  We discussed this medications mechanism of action, common side effects, and dosing schedule.  Patient verbalized understanding and is agreeable to this plan.  We will follow-up in 4-6 weeks to ensure that he is tolerating the medication well and discuss increasing the dose.

## 2023-03-05 NOTE — Patient Instructions (Addendum)
If [place] is not able to get the lowest doses of Wegovy, I can send them to the [employer].

## 2023-03-08 ENCOUNTER — Other Ambulatory Visit (HOSPITAL_COMMUNITY): Payer: Self-pay

## 2023-03-08 ENCOUNTER — Encounter: Payer: Self-pay | Admitting: Family Medicine

## 2023-03-08 MED ORDER — SEMAGLUTIDE-WEIGHT MANAGEMENT 0.25 MG/0.5ML ~~LOC~~ SOAJ
0.2500 mg | SUBCUTANEOUS | 1 refills | Status: DC
  Filled 2023-03-08: qty 2, 28d supply, fill #0
  Filled 2023-03-31: qty 2, 28d supply, fill #1

## 2023-03-13 ENCOUNTER — Encounter: Payer: Self-pay | Admitting: Internal Medicine

## 2023-03-14 ENCOUNTER — Telehealth: Payer: Self-pay | Admitting: *Deleted

## 2023-03-14 NOTE — Telephone Encounter (Signed)
-----   Message from Melida Quitter sent at 03/09/2023 12:23 PM EDT ----- Home sleep study ordered on 7/15

## 2023-04-03 ENCOUNTER — Encounter: Payer: Self-pay | Admitting: Internal Medicine

## 2023-04-03 ENCOUNTER — Ambulatory Visit (AMBULATORY_SURGERY_CENTER): Payer: 59

## 2023-04-03 ENCOUNTER — Other Ambulatory Visit (HOSPITAL_COMMUNITY): Payer: Self-pay

## 2023-04-03 ENCOUNTER — Other Ambulatory Visit: Payer: Self-pay

## 2023-04-03 VITALS — Ht 74.0 in | Wt 282.0 lb

## 2023-04-03 DIAGNOSIS — Z1211 Encounter for screening for malignant neoplasm of colon: Secondary | ICD-10-CM

## 2023-04-03 MED ORDER — NA SULFATE-K SULFATE-MG SULF 17.5-3.13-1.6 GM/177ML PO SOLN: 1.0000 | Freq: Once | ORAL | 0 refills | Status: AC

## 2023-04-03 NOTE — Progress Notes (Signed)
 No egg or soy allergy known to patient  No issues known to pt with past sedation with any surgeries or procedures Patient denies ever being told they had issues or difficulty with intubation  No FH of Malignant Hyperthermia Pt is not on diet pills Pt is not on  home 02  Pt is not on blood thinners  Pt denies issues with chronic constipation  No A fib or A flutter Have any cardiac testing pending--no Patient's chart reviewed by Cathlyn Parsons CNRA prior to previsit and patient appropriate for the [facility].  Previsit completed and red dot placed by patient's name on their procedure day (on provider's schedule).   Ambulates independently Pt instructed to use [email or web address] or GoodRx for a price reduction on prep

## 2023-04-09 ENCOUNTER — Encounter: Payer: Self-pay | Admitting: Family Medicine

## 2023-04-09 ENCOUNTER — Ambulatory Visit: Payer: 59 | Admitting: Family Medicine

## 2023-04-09 DIAGNOSIS — Z6836 Body mass index (BMI) 36.0-36.9, adult: Secondary | ICD-10-CM | POA: Diagnosis not present

## 2023-04-09 DIAGNOSIS — I10 Essential (primary) hypertension: Secondary | ICD-10-CM | POA: Diagnosis not present

## 2023-04-09 MED ORDER — WEGOVY 0.5 MG/0.5ML ~~LOC~~ SOAJ
0.5000 mg | SUBCUTANEOUS | 2 refills | Status: DC
  Filled 2023-04-16: qty 2, 28d supply, fill #0
  Filled 2023-05-16: qty 2, 28d supply, fill #1
  Filled 2023-06-24 – 2023-06-25 (×2): qty 2, 28d supply, fill #2

## 2023-04-09 NOTE — Assessment & Plan Note (Signed)
BP goal <140/90.  Blood pressure above goal in office but improved from last visit.  Continue hydrochlorothiazide 12.5 mg daily until next follow-up appointment.  At that time, may consider increasing to hydrochlorothiazide 25 mg daily.  Will continue to monitor.

## 2023-04-09 NOTE — Progress Notes (Signed)
   Established Patient Office Visit  Subjective   Patient ID: Drew Navarro, male    DOB: 12-21-1975  Age: 47 y.o. MRN: [id number]  Chief Complaint  Patient presents with   Weight Check    HPI Drew Navarro is a 47 y.o. male presenting today for follow up of weight management. Weight management: Weight has remained stable since last visit.  He has continued with a caloric deficit and routine physical activity.  He is currently taking Wegovy, but he has only had 2 doses as he has a colonoscopy later this week and was told to hold [facility] until it is completed.  He has not had any issues or side effects with his first 2 doses of Wegovy.  ROS Negative unless otherwise noted in HPI   Objective:     BP (!) 146/104 (BP Location: Left Arm, Patient Position: Sitting, Cuff Size: Large)   Pulse 79   Resp 18   Ht 6\' 2"  (1.88 m)   Wt 283 lb (128.4 kg)   SpO2 95%   BMI 36.34 kg/m   Physical Exam Constitutional:      General: He is not in acute distress.    Appearance: Normal appearance.  HENT:     Head: Normocephalic and atraumatic.  Cardiovascular:     Rate and Rhythm: Normal rate and regular rhythm.     Heart sounds: Normal heart sounds. No murmur heard.    No friction rub. No gallop.  Pulmonary:     Effort: Pulmonary effort is normal. No respiratory distress.     Breath sounds: Normal breath sounds. No wheezing, rhonchi or rales.  Skin:    General: Skin is warm and dry.  Neurological:     Mental Status: He is alert and oriented to person, place, and time.  Psychiatric:        Mood and Affect: Mood normal.      Assessment & Plan:  Class 2 severe obesity due to excess calories with serious comorbidity and body mass index (BMI) of 36.0 to 36.9 in adult [facility]) Assessment & Plan: Visit #2: Starting Weight: 282 lbs  Current weight: 282 lbs Previous weight: 282 lbs Change in weight: +1 lb Medication: Wegovy 0.25 mg weekly for another 6 weeks, then increase to Wegovy 0.5  mg weekly Follow-up and referrals: 2 month follow up  Orders: -     WUJWJX; Inject 0.5 mg into the skin once a week.  Dispense: 2 mL; Refill: 2  Primary hypertension Assessment & Plan: BP goal <140/90.  Blood pressure above goal in office but improved from last visit.  Continue hydrochlorothiazide 12.5 mg daily until next follow-up appointment.  At that time, may consider increasing to hydrochlorothiazide 25 mg daily.  Will continue to monitor.     Return in about 10 weeks (around 06/18/2023) for follow-up for weight management and HTN.    Melida Quitter, PA

## 2023-04-09 NOTE — Assessment & Plan Note (Signed)
Visit #2: Starting Weight: 282 lbs  Current weight: 282 lbs Previous weight: 282 lbs Change in weight: +1 lb Medication: Wegovy 0.25 mg weekly for another 6 weeks, then increase to Wegovy 0.5 mg weekly Follow-up and referrals: 2 month follow up

## 2023-04-09 NOTE — Patient Instructions (Signed)
You can pick up the next dose of the [facility] in a few weeks, and I will see you in a couple of months!

## 2023-04-16 ENCOUNTER — Ambulatory Visit (AMBULATORY_SURGERY_CENTER): Payer: 59 | Admitting: Internal Medicine

## 2023-04-16 ENCOUNTER — Other Ambulatory Visit (HOSPITAL_COMMUNITY): Payer: Self-pay

## 2023-04-16 ENCOUNTER — Encounter: Payer: Self-pay | Admitting: Internal Medicine

## 2023-04-16 VITALS — BP 133/100 | HR 67 | Temp 97.5°F | Resp 17 | Ht 74.0 in | Wt 282.0 lb

## 2023-04-16 DIAGNOSIS — Z1211 Encounter for screening for malignant neoplasm of colon: Secondary | ICD-10-CM

## 2023-04-16 DIAGNOSIS — D128 Benign neoplasm of rectum: Secondary | ICD-10-CM

## 2023-04-16 DIAGNOSIS — D122 Benign neoplasm of ascending colon: Secondary | ICD-10-CM

## 2023-04-16 DIAGNOSIS — D123 Benign neoplasm of transverse colon: Secondary | ICD-10-CM

## 2023-04-16 DIAGNOSIS — K635 Polyp of colon: Secondary | ICD-10-CM

## 2023-04-16 MED ORDER — SODIUM CHLORIDE 0.9 % IV SOLN: 500.0000 mL | Freq: Once | INTRAVENOUS | Status: DC

## 2023-04-16 NOTE — Progress Notes (Signed)
VS by DT  Pt's states no medical or surgical changes since previsit or office visit.  

## 2023-04-16 NOTE — Progress Notes (Signed)
Called to room to assist during endoscopic procedure.  Patient ID and intended procedure confirmed with present staff. Received instructions for my participation in the procedure from the performing physician.  

## 2023-04-16 NOTE — Progress Notes (Signed)
Pt resting comfortably. VSS. Airway intact. SBAR complete to RN. All questions answered.   

## 2023-04-16 NOTE — Patient Instructions (Signed)
Resume all of your regular medications today as ordered.  Read the handouts given to you by your recovery room nurse.  YOU HAD AN ENDOSCOPIC PROCEDURE TODAY AT THE [facility]:   Refer to the procedure report that was given to you for any specific questions about what was found during the examination.  If the procedure report does not answer your questions, please call your gastroenterologist to clarify.  If you requested that your care partner not be given the details of your procedure findings, then the procedure report has been included in a sealed envelope for you to review at your convenience later.  YOU SHOULD EXPECT: Some feelings of bloating in the abdomen. Passage of more gas than usual.  Walking can help get rid of the air that was put into your GI tract during the procedure and reduce the bloating. If you had a lower endoscopy (such as a colonoscopy or flexible sigmoidoscopy) you may notice spotting of blood in your stool or on the toilet paper. If you underwent a bowel prep for your procedure, you may not have a normal bowel movement for a few days.  Please Note:  You might notice some irritation and congestion in your nose or some drainage.  This is from the oxygen used during your procedure.  There is no need for concern and it should clear up in a day or so.  SYMPTOMS TO REPORT IMMEDIATELY:  Following lower endoscopy (colonoscopy or flexible sigmoidoscopy):  Excessive amounts of blood in the stool  Significant tenderness or worsening of abdominal pains  Swelling of the abdomen that is new, acute  Fever of 100F or higher   For urgent or emergent issues, a gastroenterologist can be reached at any hour by calling [phone]. Do not use MyChart messaging for urgent concerns.    DIET:  We do recommend a small meal at first, but then you may proceed to your regular diet.  Drink plenty of fluids but you should avoid alcoholic beverages for 24 hours.  ACTIVITY:  You  should plan to take it easy for the rest of today and you should NOT DRIVE or use heavy machinery until tomorrow (because of the sedation medicines used during the test).    FOLLOW UP: Our staff will call the number listed on your records the next business day following your procedure.  We will call around 7:15- 8:00 am to check on you and address any questions or concerns that you may have regarding the information given to you following your procedure. If we do not reach you, we will leave a message.     If any biopsies were taken you will be contacted by phone or by letter within the next 1-3 weeks.  Please call us at [phone] if you have not heard about the biopsies in 3 weeks.    SIGNATURES/CONFIDENTIALITY: You and/or your care partner have signed paperwork which will be entered into your electronic medical record.  These signatures attest to the fact that that the information above on your After Visit Summary has been reviewed and is understood.  Full responsibility of the confidentiality of this discharge information lies with you and/or your care-partner.

## 2023-04-16 NOTE — Progress Notes (Signed)
GASTROENTEROLOGY PROCEDURE H&P NOTE   Primary Care Physician: Melida Quitter, PA    Reason for Procedure:   Colon cancer screening  Plan:    Colonoscopy  Patient is appropriate for endoscopic procedure(s) in the ambulatory ([facility]) setting.  The nature of the procedure, as well as the risks, benefits, and alternatives were carefully and thoroughly reviewed with the patient. Ample time for discussion and questions allowed. The patient understood, was satisfied, and agreed to proceed.     HPI: Drew Navarro is a 47 y.o. male who presents for colonoscopy for colon cancer screening. Denies blood in stools, changes in bowel habits, or unintentional weight loss. Denies family history of colon cancer. This is his first colonoscopy.  Past Medical History:  Diagnosis Date   Hypertension    PTSD (post-traumatic stress disorder) 03/08/2022    Past Surgical History:  Procedure Laterality Date   VASECTOMY  2017   VASECTOMY REVERSAL  2023    Prior to Admission medications   Medication Sig Start Date End Date Taking? Authorizing Provider  hydrochlorothiazide (HYDRODIURIL) 12.5 MG tablet Take 1 tablet (12.5 mg total) by mouth daily. 03/05/23   Melida Quitter, PA  Semaglutide-Weight Management (WEGOVY) 0.25 MG/0.5ML SOAJ Inject 0.25 mg into the skin once a week. 03/05/23   Saralyn Pilar A, PA  WEGOVY 0.5 MG/0.5ML SOAJ Inject 0.5 mg into the skin once a week. 05/11/23   Melida Quitter, PA    Current Outpatient Medications  Medication Sig Dispense Refill   hydrochlorothiazide (HYDRODIURIL) 12.5 MG tablet Take 1 tablet (12.5 mg total) by mouth daily. 30 tablet 3   Semaglutide-Weight Management (WEGOVY) 0.25 MG/0.5ML SOAJ Inject 0.25 mg into the skin once a week. 2 mL 1   [START ON 05/11/2023] WEGOVY 0.5 MG/0.5ML SOAJ Inject 0.5 mg into the skin once a week. 2 mL 2   Current Facility-Administered Medications  Medication Dose Route Frequency Provider Last Rate Last Admin   0.9 %   sodium chloride infusion  500 mL Intravenous Once Imogene Burn, MD        Allergies as of 04/16/2023   (No Known Allergies)    Family History  Problem Relation Age of Onset   Heart attack Father    Colon cancer Neg Hx    Colon polyps Neg Hx    Esophageal cancer Neg Hx    Rectal cancer Neg Hx    Stomach cancer Neg Hx     Social History   Socioeconomic History   Marital status: Married    Spouse name: Not on file   Number of children: Not on file   Years of education: Not on file   Highest education level: Not on file  Occupational History   Not on file  Tobacco Use   Smoking status: Never    Passive exposure: Never   Smokeless tobacco: Never  Vaping Use   Vaping status: Never Used  Substance and Sexual Activity   Alcohol use: Yes    Alcohol/week: 10.0 standard drinks of alcohol    Types: 10 Cans of beer per week   Drug use: Never   Sexual activity: Yes  Other Topics Concern   Not on file  Social History Narrative   Not on file   Social Determinants of Health   Financial Resource Strain: Not on file  Food Insecurity: Not on file  Transportation Needs: Not on file  Physical Activity: Not on file  Stress: Not on file  Social Connections: Not on  file  Intimate Partner Violence: Not on file    Physical Exam: Vital signs in last 24 hours: BP (!) 140/88   Pulse 68   Temp (!) 97.5 F (36.4 C)   Ht 6\' 2"  (1.88 m)   Wt 282 lb (127.9 kg)   SpO2 98%   BMI 36.21 kg/m  GEN: NAD EYE: Sclerae anicteric ENT: MMM CV: Non-tachycardic Pulm: No increased work of breathing GI: Soft, NT/ND NEURO:  Alert & Oriented   Eulah Pont, MD [facility]  04/16/2023 9:49 AM

## 2023-04-16 NOTE — Op Note (Signed)
[facility] Patient Name: Drew Navarro Procedure Date: 04/16/2023 9:55 AM MRN: [id number] Endoscopist: Madelyn Brunner [place] , , [id number] Age: 47 Referring MD:  Date of Birth: Jan 08, 1976 Gender: Male Account #: [id number] Procedure:                Colonoscopy Indications:              Screening for colorectal malignant neoplasm, This                            is the patient's first colonoscopy Medicines:                Monitored Anesthesia Care Procedure:                Pre-Anesthesia Assessment:                           - Prior to the procedure, a History and Physical                            was performed, and patient medications and                            allergies were reviewed. The patient's tolerance of                            previous anesthesia was also reviewed. The risks                            and benefits of the procedure and the sedation                            options and risks were discussed with the patient.                            All questions were answered, and informed consent                            was obtained. Prior Anticoagulants: The patient has                            taken no anticoagulant or antiplatelet agents. ASA                            Grade Assessment: II - A patient with mild systemic                            disease. After reviewing the risks and benefits,                            the patient was deemed in satisfactory condition to                            undergo the procedure.  After obtaining informed consent, the colonoscope                            was passed under direct vision. Throughout the                            procedure, the patient's blood pressure, pulse, and                            oxygen saturations were monitored continuously. The                            Olympus Scope [id number] was introduced through the                            anus and  advanced to the the cecum, identified by                            appendiceal orifice and ileocecal valve. The                            colonoscopy was performed without difficulty. The                            patient tolerated the procedure well. The quality                            of the bowel preparation was good. The ileocecal                            valve, appendiceal orifice, and rectum were                            photographed. Scope In: 9:59:55 AM Scope Out: 10:14:46 AM Scope Withdrawal Time: 0 hours 8 minutes 32 seconds  Total Procedure Duration: 0 hours 14 minutes 51 seconds  Findings:                 The terminal ileum appeared normal.                           Four sessile polyps were found in the transverse                            colon and ascending colon. The polyps were 4 to 8                            mm in size. These polyps were removed with a cold                            snare. Resection and retrieval were complete.                           A 3 mm polyp was found in the rectum.  The polyp was                            sessile. The polyp was removed with a cold snare.                            Resection and retrieval were complete.                           Non-bleeding internal hemorrhoids were found during                            retroflexion. Complications:            No immediate complications. Estimated Blood Loss:     Estimated blood loss was minimal. Impression:               - The examined portion of the ileum was normal.                           - Four 4 to 8 mm polyps in the transverse colon and                            in the ascending colon, removed with a cold snare.                            Resected and retrieved.                           - One 3 mm polyp in the rectum, removed with a cold                            snare. Resected and retrieved.                           - Non-bleeding internal  hemorrhoids. Recommendation:           - Discharge patient to home (with escort).                           - Await pathology results.                           - The findings and recommendations were discussed                            with the patient. Dr Particia Lather "[place]" [place],  04/16/2023 10:18:29 AM

## 2023-04-17 ENCOUNTER — Telehealth: Payer: Self-pay

## 2023-04-17 NOTE — Telephone Encounter (Signed)
  Follow up Call-     04/16/2023    9:30 AM  Call back number  Post procedure Call Back phone  # [phone]  Permission to leave phone message Yes     Patient questions:  Do you have a fever, pain , or abdominal swelling? No. Pain Score  0 *  Have you tolerated food without any problems? Yes.    Have you been able to return to your normal activities? Yes.    Do you have any questions about your discharge instructions: Diet   No. Medications  No. Follow up visit  No.  Do you have questions or concerns about your Care? No.  Actions: * If pain score is 4 or above: No action needed, pain <4.

## 2023-04-18 ENCOUNTER — Encounter: Payer: Self-pay | Admitting: Internal Medicine

## 2023-05-01 ENCOUNTER — Ambulatory Visit: Payer: 59

## 2023-06-18 ENCOUNTER — Ambulatory Visit: Payer: 59 | Admitting: Family Medicine

## 2023-06-25 ENCOUNTER — Other Ambulatory Visit (HOSPITAL_COMMUNITY): Payer: Self-pay

## 2023-06-26 ENCOUNTER — Other Ambulatory Visit (HOSPITAL_COMMUNITY): Payer: Self-pay

## 2023-06-27 ENCOUNTER — Other Ambulatory Visit (HOSPITAL_COMMUNITY): Payer: Self-pay

## 2023-06-28 ENCOUNTER — Encounter: Payer: Self-pay | Admitting: Family Medicine

## 2023-07-10 ENCOUNTER — Ambulatory Visit: Payer: 59 | Admitting: Family Medicine

## 2023-07-10 ENCOUNTER — Other Ambulatory Visit: Payer: Self-pay

## 2023-07-10 ENCOUNTER — Encounter (HOSPITAL_COMMUNITY): Payer: Self-pay

## 2023-07-10 ENCOUNTER — Other Ambulatory Visit (HOSPITAL_COMMUNITY): Payer: Self-pay

## 2023-07-10 VITALS — BP 146/100 | HR 69 | Temp 98.1°F | Resp 18 | Ht 74.0 in | Wt 265.0 lb

## 2023-07-10 DIAGNOSIS — I10 Essential (primary) hypertension: Secondary | ICD-10-CM

## 2023-07-10 DIAGNOSIS — Z6836 Body mass index (BMI) 36.0-36.9, adult: Secondary | ICD-10-CM

## 2023-07-10 DIAGNOSIS — E66812 Obesity, class 2: Secondary | ICD-10-CM

## 2023-07-10 DIAGNOSIS — J069 Acute upper respiratory infection, unspecified: Secondary | ICD-10-CM | POA: Diagnosis not present

## 2023-07-10 LAB — POC COVID19/FLU A&B COMBO
Covid Antigen, POC: NEGATIVE
Influenza A Antigen, POC: NEGATIVE
Influenza B Antigen, POC: NEGATIVE

## 2023-07-10 MED ORDER — FLUTICASONE PROPIONATE 50 MCG/ACT NA SUSP
2.0000 | Freq: Every day | NASAL | 6 refills | Status: AC
Start: 2023-07-10 — End: ?
  Filled 2023-07-10: qty 16, 30d supply, fill #0

## 2023-07-10 MED ORDER — GUAIFENESIN ER 600 MG PO TB12
600.0000 mg | ORAL_TABLET | Freq: Two times a day (BID) | ORAL | 0 refills | Status: DC
  Filled 2023-07-10: qty 20, 10d supply, fill #0

## 2023-07-10 MED ORDER — HYDROCHLOROTHIAZIDE 25 MG PO TABS
25.0000 mg | ORAL_TABLET | Freq: Every day | ORAL | 3 refills | Status: DC
  Filled 2023-07-10: qty 30, 30d supply, fill #0

## 2023-07-10 MED ORDER — WEGOVY 0.5 MG/0.5ML ~~LOC~~ SOAJ
0.5000 mg | SUBCUTANEOUS | 2 refills | Status: DC
  Filled 2023-07-10 (×3): qty 2, 28d supply, fill #0
  Filled 2023-08-27 – 2023-09-18 (×3): qty 2, 28d supply, fill #1

## 2023-07-10 NOTE — Progress Notes (Signed)
Established Patient Office Visit  Subjective   Patient ID: Drew Navarro, male    DOB: October 05, 1975  Age: 47 y.o. MRN: [id number]  Chief Complaint  Patient presents with   Weight Check   Nasal Congestion        Fatigue   scratchy throat    HPI Eliberto Navarro is a 47 y.o. male presenting today for follow up of weight management.  He also complains of 2 days of nasal congestion, scratchy throat, fatigue.  He has not taken any over-the-counter medication. Weight management: Weight has decreased 17 lbs since last visit.  He is eating in a caloric deficit and gets routine physical activity.  He is currently taking Wegovy 0.5 mg weekly.    Outpatient Medications Prior to Visit  Medication Sig   [DISCONTINUED] hydrochlorothiazide (HYDRODIURIL) 12.5 MG tablet Take 1 tablet (12.5 mg total) by mouth daily.   [DISCONTINUED] Semaglutide-Weight Management (WEGOVY) 0.25 MG/0.5ML SOAJ Inject 0.25 mg into the skin once a week.   [DISCONTINUED] WEGOVY 0.5 MG/0.5ML SOAJ Inject 0.5 mg into the skin once a week.   No facility-administered medications prior to visit.    ROS Negative unless otherwise noted in HPI   Objective:     BP (!) 146/100 (BP Location: Left Arm, Patient Position: Sitting, Cuff Size: Large)   Pulse 69   Temp 98.1 F (36.7 C) (Oral)   Resp 18   Ht 6\' 2"  (1.88 m)   Wt 265 lb (120.2 kg)   SpO2 96%   BMI 34.02 kg/m   Physical Exam Constitutional:      General: He is not in acute distress.    Appearance: Normal appearance.  HENT:     Head: Normocephalic and atraumatic.  Cardiovascular:     Rate and Rhythm: Normal rate and regular rhythm.     Heart sounds: Normal heart sounds. No murmur heard.    No friction rub. No gallop.  Pulmonary:     Effort: Pulmonary effort is normal. No respiratory distress.     Breath sounds: Normal breath sounds. No wheezing, rhonchi or rales.  Skin:    General: Skin is warm and dry.  Neurological:     Mental Status: He is alert  and oriented to person, place, and time.  Psychiatric:        Mood and Affect: Mood normal.    Results for orders placed or performed in visit on 07/10/23  POC Covid19/Flu A&B Antigen  Result Value Ref Range   Influenza A Antigen, POC Negative Negative   Influenza B Antigen, POC Negative Negative   Covid Antigen, POC Negative Negative    Assessment & Plan:  Class 2 severe obesity due to excess calories with serious comorbidity and body mass index (BMI) of 36.0 to 36.9 in adult [facility]) Assessment & Plan: Visit #3: Starting Weight: 282 lbs  Current weight: 265 lbs Previous weight: 282 lbs Change in weight: -17 lb (-6% starting weight) Medication: continue Wegovy 0.5 mg weekly Follow-up and referrals: 3 month follow up  Orders: -     XLKGMW; Inject 0.5 mg into the skin once a week.  Dispense: 2 mL; Refill: 2  Primary hypertension Assessment & Plan: BP goal <140/90.  Blood pressure above goal consistently in the last several months, increase to hydrochlorothiazide 25 mg daily.  Will continue to monitor.  Orders: -     hydroCHLOROthiazide; Take 1 tablet (25 mg total) by mouth daily.  Dispense: 90 tablet; Refill: 3  Upper respiratory tract infection, unspecified  type -     POC Covid19/Flu A&B Antigen -     guaiFENesin ER; Take 1 tablet (600 mg total) by mouth 2 (two) times daily.  Dispense: 14 tablet; Refill: 0 -     Fluticasone Propionate; Place 2 sprays into both nostrils daily.  Dispense: 16 g; Refill: 6  Point-of-care tests negative for influenza and COVID.  Recommend supportive management with increased hydration, guaifenesin, and fluticasone nasal spray.  Also recommended Tylenol or ibuprofen for sore throat.  Patient verbalized understanding and is agreeable to this plan.  Return in about 3 months (around 10/10/2023) for follow-up for weight management.    Melida Quitter, PA

## 2023-07-10 NOTE — Assessment & Plan Note (Signed)
BP goal <140/90.  Blood pressure above goal consistently in the last several months, increase to hydrochlorothiazide 25 mg daily.  Will continue to monitor.

## 2023-07-10 NOTE — Patient Instructions (Addendum)
In addition to the medicine for congestion and the nasal spray, stay very hydrated so that your urine runs pale yellow.  You may also take Tylenol or ibuprofen to reduce inflammation and pain in your throat.  Keep up the fantastic work and continue prioritizing building muscle!  Let me know if anything changes in the next few months, otherwise I will see you in February to check in.

## 2023-07-10 NOTE — Assessment & Plan Note (Addendum)
Visit #3: Starting Weight: 282 lbs  Current weight: 265 lbs Previous weight: 282 lbs Change in weight: -17 lb (-6% starting weight) Medication: continue Wegovy 0.5 mg weekly Follow-up and referrals: 3 month follow up

## 2023-07-11 ENCOUNTER — Other Ambulatory Visit (HOSPITAL_COMMUNITY): Payer: Self-pay

## 2023-07-12 ENCOUNTER — Other Ambulatory Visit (HOSPITAL_COMMUNITY): Payer: Self-pay

## 2023-09-07 ENCOUNTER — Other Ambulatory Visit (HOSPITAL_COMMUNITY): Payer: Self-pay

## 2023-09-18 ENCOUNTER — Other Ambulatory Visit (HOSPITAL_COMMUNITY): Payer: Self-pay

## 2023-09-27 ENCOUNTER — Encounter: Payer: Self-pay | Admitting: Family Medicine

## 2023-10-10 ENCOUNTER — Ambulatory Visit: Payer: 59 | Admitting: Family Medicine

## 2024-06-11 ENCOUNTER — Telehealth: Payer: Self-pay

## 2024-06-11 DIAGNOSIS — F411 Generalized anxiety disorder: Secondary | ICD-10-CM | POA: Insufficient documentation

## 2024-06-11 NOTE — Telephone Encounter (Signed)
 Copied from CRM [id number]. Topic: General - Other >> Jun 11, 2024 11:31 AM Tobias CROME wrote: Reason for CRM: Patient was a patient of [facility]. Patient is needing a letter stating he has anxiety or a history for a state exam he is needing to take.   Patient has scheduled follow up visit with Saddie for December but inquiring if letter could be written before then.

## 2024-06-11 NOTE — Telephone Encounter (Signed)
 Letter written and signed and is at the front desk for him to pick up during business hours

## 2024-06-13 ENCOUNTER — Telehealth: Payer: Self-pay

## 2024-06-13 NOTE — Telephone Encounter (Signed)
 The signed letter is written and is at the front office for him to pick up. Please call and ask if he will come pick it up

## 2024-06-13 NOTE — Telephone Encounter (Signed)
 Copied from CRM [id number]. Topic: General - Other >> Jun 11, 2024 11:31 AM Tobias CROME wrote: Reason for CRM: Patient was a patient of [facility]. Patient is needing a letter stating he has anxiety or a history for a state exam he is needing to take.   Patient has scheduled follow up visit with Saddie for December but inquiring if letter could be written before then.

## 2024-07-22 ENCOUNTER — Encounter

## 2024-09-15 ENCOUNTER — Encounter

## 2024-09-17 ENCOUNTER — Other Ambulatory Visit: Payer: Self-pay

## 2024-09-17 ENCOUNTER — Ambulatory Visit: Admission: RE | Admit: 2024-09-17 | Discharge: 2024-09-17 | Disposition: A | Source: Ambulatory Visit

## 2024-09-17 DIAGNOSIS — W19XXXA Unspecified fall, initial encounter: Secondary | ICD-10-CM

## 2024-09-18 ENCOUNTER — Ambulatory Visit

## 2024-09-18 VITALS — BP 149/89 | HR 95 | Temp 98.0°F | Ht 74.0 in | Wt 289.0 lb

## 2024-09-18 DIAGNOSIS — Z0000 Encounter for general adult medical examination without abnormal findings: Secondary | ICD-10-CM

## 2024-09-18 DIAGNOSIS — Z6837 Body mass index (BMI) 37.0-37.9, adult: Secondary | ICD-10-CM

## 2024-09-18 DIAGNOSIS — I10 Essential (primary) hypertension: Secondary | ICD-10-CM | POA: Diagnosis not present

## 2024-09-18 DIAGNOSIS — R29818 Other symptoms and signs involving the nervous system: Secondary | ICD-10-CM | POA: Diagnosis not present

## 2024-09-18 DIAGNOSIS — F411 Generalized anxiety disorder: Secondary | ICD-10-CM | POA: Diagnosis not present

## 2024-09-18 DIAGNOSIS — E66812 Obesity, class 2: Secondary | ICD-10-CM

## 2024-09-18 DIAGNOSIS — M542 Cervicalgia: Secondary | ICD-10-CM | POA: Diagnosis not present

## 2024-09-18 DIAGNOSIS — Z1283 Encounter for screening for malignant neoplasm of skin: Secondary | ICD-10-CM | POA: Diagnosis not present

## 2024-09-18 DIAGNOSIS — Z6836 Body mass index (BMI) 36.0-36.9, adult: Secondary | ICD-10-CM

## 2024-09-18 DIAGNOSIS — Z23 Encounter for immunization: Secondary | ICD-10-CM | POA: Diagnosis not present

## 2024-09-18 MED ORDER — HYDROCHLOROTHIAZIDE 25 MG PO TABS: 25.0000 mg | ORAL_TABLET | Freq: Every day | ORAL | 3 refills | Status: AC

## 2024-09-18 MED ORDER — WEGOVY 0.25 MG/0.5ML ~~LOC~~ SOAJ: 0.2500 mg | SUBCUTANEOUS | 2 refills | Status: AC

## 2024-09-18 MED ORDER — CYCLOBENZAPRINE HCL 10 MG PO TABS: 10.0000 mg | ORAL_TABLET | Freq: Three times a day (TID) | ORAL | 0 refills | Status: AC | PRN

## 2024-09-18 NOTE — Assessment & Plan Note (Signed)
 BP goal <140/90. Blood pressure above goal consistently. Improved at 149/89 on recheck today. Patient has not been taking hydrochlorothiazide  but reports he will start back taking it. Refill sent today. Suspect with anticipated weight loss on Wegovy , blood pressure will improve.

## 2024-09-18 NOTE — Assessment & Plan Note (Signed)
 Stable currently without medication. Advised to follow up if symptoms worsen. Will cont to monitor.

## 2024-09-18 NOTE — Patient Instructions (Signed)
 VISIT SUMMARY: Today, we discussed your annual physical exam, medication management, and preventive health measures. We addressed your weight management, hypertension, recent musculoskeletal injury, and preventive screenings.  YOUR PLAN: ACUTE LOW BACK PAIN WITH MUSCLE STRAIN: You recently injured your back and side from a fall. The pain is partially managed with over-the-counter medication. -Your x-ray shows no fractures, but we are waiting for the radiologist's report. -You can receive a Toradol  injection for pain management if needed. -I have prescribed a muscle relaxer for nighttime use as needed.  OBESITY: You previously used Wegovy  for weight management but stopped due to nausea at a higher dose. You are interested in restarting it at a lower dose. -Restart Wegovy  at the lowest dose and increase monthly as tolerated. -We have completed the prior authorization for Wegovy . -I provided information on Wegovy 's savings card. -Monitor for nausea and use anti-nausea medication if needed.  HYPERTENSION:  -Continue taking hydrochlorothiazide  as prescribed.  GENERAL HEALTH MAINTENANCE: We discussed your preventive health measures and screenings. -You received a flu shot today. -Plan for prostate cancer screening at age 23. -Continue routine health maintenance and vaccinations.  If you have any problems before your next visit feel free to message me via MyChart (minor issues or questions) or call the office, otherwise you may reach out to schedule an office visit.  Thank you! Saddie Sacks, PA-C

## 2024-09-18 NOTE — Progress Notes (Signed)
 "  Complete physical exam  Patient: Drew Navarro   DOB: 1976/02/02   49 y.o. Male  MRN: [id number]  Subjective:    Chief Complaint  Patient presents with   Annual Exam    Patient stated he fell on the ice and injured his BACK and NECK.     History of Present Illness   Drew Navarro is a 49 year old male who presents for an annual physical exam and to discuss medication management.  Weight management - Previously used Wegovy  for weight management. - Discontinued Wegovy  due to nausea when dosage increased in the past  - Interested in restarting Wegovy , considering cost and insurance coverage.  Hypertension - Has not been taking hydrochlorothiazide  recently for blood pressure management. - Blood pressure readings have been better than usual despite discontinuation of medication.  Musculoskeletal injury - Recently slipped and fell on sheet of ice, resulting in injury to back and side. - Saw a employee health provider who offered Toradol  injection (declined) and did send him for x-rays at [facility] today which he had done.  - Awaiting x-ray results to rule out fractures. - Manages pain with ibuprofen. - Experiences discomfort primarily in the back, especially with movement  Preventive health and screening - Has not had recent blood work but plans to have it done soon with his employee physical  - Notes that he will drop off test results when they are back for us  to scan into his chart  - Interested in prostate cancer screening. - Considering receiving a flu shot for additional protection due to having a newborn at home.          Most recent fall risk assessment:    09/18/2024    4:24 PM  Fall Risk   Falls in the past year? 1  Number falls in past yr: 0  Injury with Fall? 1  Risk for fall due to : No Fall Risks  Follow up Falls evaluation completed     Most recent depression screenings:    09/18/2024    4:24 PM 04/09/2023    2:56 PM  PHQ 2/9 Scores   PHQ - 2 Score 0 1  PHQ- 9 Score 0 3      Data saved with a previous flowsheet row definition    Vision:Within last year and Dental: No current dental problems and Receives regular dental care    Patient Care Team: Gayle Saddie JULIANNA DEVONNA as PCP - General (Physician Assistant)   Show/hide medication list[1]  ROS   Per HPI     Objective:     BP (!) 149/89   Pulse 95   Temp 98 F (36.7 C) (Oral)   Ht 6' 2 (1.88 m)   Wt 289 lb 0.6 oz (131.1 kg)   SpO2 97%   BMI 37.11 kg/m    Physical Exam Constitutional:      General: He is not in acute distress.    Appearance: Normal appearance.  Cardiovascular:     Rate and Rhythm: Normal rate and regular rhythm.     Heart sounds: Normal heart sounds. No murmur heard.    No friction rub. No gallop.  Pulmonary:     Effort: Pulmonary effort is normal. No respiratory distress.     Breath sounds: Normal breath sounds.  Musculoskeletal:        General: No swelling.     Cervical back: Tenderness present. Pain with movement and muscular tenderness present. No spinous process tenderness. Decreased range of  motion (2/2 pain bilaterally).  Skin:    General: Skin is warm and dry.  Neurological:     General: No focal deficit present.     Mental Status: He is alert.  Psychiatric:        Mood and Affect: Mood normal.        Behavior: Behavior normal.        Thought Content: Thought content normal.       No results found for any visits on 09/18/24. Last CBC Lab Results  Component Value Date   WBC 7.7 03/01/2023   HGB 15.8 03/01/2023   HCT 46.0 03/01/2023   MCV 90 03/01/2023   MCH 30.7 03/01/2023   RDW 13.7 03/01/2023   PLT 239 03/01/2023   Last metabolic panel Lab Results  Component Value Date   GLUCOSE 100 (H) 03/01/2023   NA 141 03/01/2023   K 4.7 03/01/2023   CL 102 03/01/2023   CO2 25 03/01/2023   BUN 18 03/01/2023   CREATININE 1.43 (H) 03/01/2023   EGFR 62 03/01/2023   CALCIUM 9.7 03/01/2023   PROT 6.9  03/01/2023   ALBUMIN 4.8 03/01/2023   LABGLOB 2.1 03/01/2023   BILITOT 1.0 03/01/2023   ALKPHOS 72 03/01/2023   AST 31 03/01/2023   ALT 44 03/01/2023   Last lipids Lab Results  Component Value Date   CHOL 186 03/01/2023   HDL 59 03/01/2023   LDLCALC 112 (H) 03/01/2023   TRIG 84 03/01/2023   CHOLHDL 3.2 03/01/2023   Last hemoglobin A1c Lab Results  Component Value Date   HGBA1C 5.7 (H) 03/01/2023   Last thyroid functions Lab Results  Component Value Date   TSH 1.920 03/01/2023   FREET4 1.24 03/01/2023   Last vitamin D  Lab Results  Component Value Date   VD25OH 40.6 03/01/2023        Assessment & Plan:    Routine Health Maintenance and Physical Exam  Health Maintenance  Topic Date Due   Hepatitis B Vaccine (1 of 3 - 19+ 3-dose series) Never done   Flu Shot  Never done   COVID-19 Vaccine (1 - 2025-26 season) 10/04/2024*   Colon Cancer Screening  04/15/2026   DTaP/Tdap/Td vaccine (2 - Td or Tdap) 03/04/2033   HPV Vaccine (No Doses Required) Completed   Hepatitis C Screening  Completed   HIV Screening  Completed   Pneumococcal Vaccine  Aged Out   Meningitis B Vaccine  Aged Out  *Topic was postponed. The date shown is not the original due date.    Discussed health benefits of physical activity, and encouraged him to engage in regular exercise appropriate for his age and condition.  Encounter for vaccination -     Flu vaccine trivalent PF, 6mos and older(Flulaval,Afluria,Fluarix,Fluzone)  Primary hypertension Assessment & Plan: BP goal <140/90. Blood pressure above goal consistently. Improved at 149/89 on recheck today. Patient has not been taking hydrochlorothiazide  but reports he will start back taking it. Refill sent today. Suspect with anticipated weight loss on Wegovy , blood pressure will improve.   Orders: -     hydroCHLOROthiazide ; Take 1 tablet (25 mg total) by mouth daily.  Dispense: 31 tablet; Refill: 3  Skin exam, screening for cancer -      Ambulatory referral to Dermatology  Suspected sleep apnea -     Home sleep test; Future  Class 2 severe obesity due to excess calories with serious comorbidity and body mass index (BMI) of 37.0 to 37.9 in adult -  Wegovy ; Inject 0.25 mg into the skin once a week.  Dispense: 2 mL; Refill: 2  Generalized anxiety disorder Assessment & Plan: Stable currently without medication. Advised to follow up if symptoms worsen. Will cont to monitor.    Class 2 severe obesity due to excess calories with serious comorbidity and body mass index (BMI) of 36.0 to 36.9 in adult Assessment & Plan: Current weight: 289 lbs  Starting weight: 289 lbs  Lifestyle changes: Patient is currently following a low calorie diet and exercising regularly. He will continue to follow a low calorie diet and get at least 30 minutes of moderate physical activity 5+ days per week for best results on Wegovy .  Restart Wegovy  0.25 mg with monthly titration as tolerated.    Acute neck pain Assessment & Plan: Pain partially managed with OTC medication. X-ray showed no obvious  fractures, pending radiologist report. - Offered Toradol  injection for pain management if needed. Declined today  - Prescribed muscle relaxer for nighttime use as needed. (Cyclobenzaprine  10 mg prn)    Wellness examination Assessment & Plan: Prostate cancer screening discussed for age 7. Colonoscopy follow-up in three years. Vaccinations up to date. - Administered flu shot today. - Plan for prostate cancer screening at age 26. - Continue routine health maintenance and vaccinations.    Other orders -     Cyclobenzaprine  HCl; Take 1 tablet (10 mg total) by mouth 3 (three) times daily as needed for muscle spasms.  Dispense: 20 tablet; Refill: 0      Return in about 3 months (around 12/17/2024) for Weight.     Saddie JULIANNA Sacks, PA-C     [1]  Outpatient Medications Prior to Visit  Medication Sig   fluticasone  (FLONASE ) 50 MCG/ACT nasal spray  Place 2 sprays into both nostrils daily.   [DISCONTINUED] guaiFENesin  (MUCINEX ) 600 MG 12 hr tablet Take 1 tablet (600 mg total) by mouth 2 (two) times daily.   [DISCONTINUED] hydrochlorothiazide  (HYDRODIURIL ) 25 MG tablet Take 1 tablet (25 mg total) by mouth daily.   [DISCONTINUED] WEGOVY  0.5 MG/0.5ML SOAJ Inject 0.5 mg into the skin once a week.   No facility-administered medications prior to visit.   "

## 2024-09-18 NOTE — Assessment & Plan Note (Signed)
 Current weight: 289 lbs  Starting weight: 289 lbs  Lifestyle changes: Patient is currently following a low calorie diet and exercising regularly. He will continue to follow a low calorie diet and get at least 30 minutes of moderate physical activity 5+ days per week for best results on Wegovy .  Restart Wegovy  0.25 mg with monthly titration as tolerated.

## 2024-09-18 NOTE — Assessment & Plan Note (Signed)
 Pain partially managed with OTC medication. X-ray showed no obvious  fractures, pending radiologist report. - Offered Toradol  injection for pain management if needed. Declined today  - Prescribed muscle relaxer for nighttime use as needed. (Cyclobenzaprine  10 mg prn)

## 2024-09-18 NOTE — Assessment & Plan Note (Signed)
 Prostate cancer screening discussed for age 49. Colonoscopy follow-up in three years. Vaccinations up to date. - Administered flu shot today. - Plan for prostate cancer screening at age 7. - Continue routine health maintenance and vaccinations.

## 2024-09-22 ENCOUNTER — Other Ambulatory Visit (HOSPITAL_COMMUNITY): Payer: Self-pay

## 2024-09-23 ENCOUNTER — Other Ambulatory Visit (HOSPITAL_COMMUNITY): Payer: Self-pay

## 2024-09-24 ENCOUNTER — Other Ambulatory Visit (HOSPITAL_COMMUNITY): Payer: Self-pay

## 2024-09-26 ENCOUNTER — Other Ambulatory Visit (HOSPITAL_BASED_OUTPATIENT_CLINIC_OR_DEPARTMENT_OTHER): Payer: Self-pay | Admitting: Family Medicine

## 2024-09-26 DIAGNOSIS — Z8249 Family history of ischemic heart disease and other diseases of the circulatory system: Secondary | ICD-10-CM

## 2024-12-17 ENCOUNTER — Ambulatory Visit
